# Patient Record
Sex: Female | Born: 1990 | Race: White | Hispanic: No | Marital: Single | Smoking: Never smoker
Health system: Southern US, Community
[De-identification: ages and names within clinical notes are randomized; demographics above are authoritative.]

## PROBLEM LIST (undated history)

## (undated) DIAGNOSIS — F39 Unspecified mood [affective] disorder: Secondary | ICD-10-CM

## (undated) HISTORY — PX: KNEE SURGERY: SHX244

## (undated) HISTORY — PX: TONSILLECTOMY: SUR1361

---

## 2019-02-14 ENCOUNTER — Other Ambulatory Visit: Payer: Self-pay

## 2019-02-14 ENCOUNTER — Ambulatory Visit
Admission: EM | Admit: 2019-02-14 | Discharge: 2019-02-14 | Disposition: A | Discharge status: Home / Self Care | Payer: BC Managed Care – PPO | Attending: Family Medicine | Admitting: Family Medicine

## 2019-02-14 DIAGNOSIS — R52 Pain, unspecified: Secondary | ICD-10-CM

## 2019-02-14 DIAGNOSIS — R5383 Other fatigue: Secondary | ICD-10-CM

## 2019-02-14 DIAGNOSIS — R112 Nausea with vomiting, unspecified: Secondary | ICD-10-CM | POA: Diagnosis not present

## 2019-02-14 DIAGNOSIS — J029 Acute pharyngitis, unspecified: Secondary | ICD-10-CM | POA: Diagnosis not present

## 2019-02-14 DIAGNOSIS — M791 Myalgia, unspecified site: Secondary | ICD-10-CM | POA: Diagnosis not present

## 2019-02-14 HISTORY — DX: Unspecified mood (affective) disorder: F39

## 2019-02-14 LAB — CBC WITH DIFFERENTIAL/PLATELET
Abs Immature Granulocytes: 0.03 10*3/uL (ref 0.00–0.07)
Basophils Absolute: 0.1 10*3/uL (ref 0.0–0.1)
Basophils Relative: 1 %
Eosinophils Absolute: 0.2 10*3/uL (ref 0.0–0.5)
Eosinophils Relative: 2 %
HCT: 41.4 % (ref 36.0–46.0)
Hemoglobin: 13.4 g/dL (ref 12.0–15.0)
Immature Granulocytes: 0 %
Lymphocytes Relative: 18 %
Lymphs Abs: 2 10*3/uL (ref 0.7–4.0)
MCH: 31.3 pg (ref 26.0–34.0)
MCHC: 32.4 g/dL (ref 30.0–36.0)
MCV: 96.7 fL (ref 80.0–100.0)
Monocytes Absolute: 0.7 10*3/uL (ref 0.1–1.0)
Monocytes Relative: 6 %
Neutro Abs: 8.3 10*3/uL — ABNORMAL HIGH (ref 1.7–7.7)
Neutrophils Relative %: 73 %
Platelets: 286 10*3/uL (ref 150–400)
RBC: 4.28 MIL/uL (ref 3.87–5.11)
RDW: 12.4 % (ref 11.5–15.5)
WBC: 11.3 10*3/uL — ABNORMAL HIGH (ref 4.0–10.5)
nRBC: 0 % (ref 0.0–0.2)

## 2019-02-14 LAB — RAPID STREP SCREEN (MED CTR MEBANE ONLY): Streptococcus, Group A Screen (Direct): NEGATIVE

## 2019-02-14 LAB — MONONUCLEOSIS SCREEN: Mono Screen: NEGATIVE

## 2019-02-14 MED ORDER — AMOXICILLIN-POT CLAVULANATE 875-125 MG PO TABS: 1.0000 | ORAL_TABLET | Freq: Two times a day (BID) | ORAL | 0 refills | Status: AC

## 2019-02-14 NOTE — ED Triage Notes (Signed)
Patient states that 6 days ago she started having a sore throat, body aches, fatigue, chills without fever. Patient states that she doesn't feel like she is improving like she thought she would

## 2019-02-14 NOTE — ED Provider Notes (Signed)
[facility]    CSN: [id number] Arrival date & time: 02/14/19  1046      History   Chief Complaint Chief Complaint  Patient presents with  . Generalized Body Aches    HPI Judy Barr is a 28 y.o. female.   28 year old female presents with sore throat, body aches, chills, fatigue, headache for about 1 week. Also having nausea and lightheadedness. Vomited and had diarrhea yesterday but no vomiting yet today. No distinct fever but has had a temp of 99 and low 100 the past few days. Minimal nasal congestion. No cough. No known exposure to COVID 19. No known exposure to strep. Did have mono about 10 years ago. Has taken Nyquil and Ibuprofen with some relief. Other chronic health issues include mood disorder and currently on Lamictal, Lithium, Seroquel, Vraylar, Inderal and OrthoCyclen daily.   The history is provided by the patient.    Past Medical History:  Diagnosis Date  . Mood disorder ([place])     There are no active problems to display for this patient.   Past Surgical History:  Procedure Laterality Date  . KNEE SURGERY Left    x 3  . TONSILLECTOMY      OB History   No obstetric history on file.      Home Medications    Prior to Admission medications   Medication Sig Start Date End Date Taking? Authorizing Provider  lamoTRIgine (LAMICTAL) 100 MG tablet Take 200 mg by mouth daily. 01/28/19  Yes [provider]  lithium carbonate 300 MG capsule Take 1,200 mg by mouth at bedtime. 02/03/19  Yes [provider]  norgestimate-ethinyl estradiol (ORTHO-CYCLEN) 0.25-35 MG-MCG tablet Take by mouth.   Yes [provider]  propranolol (INDERAL) 20 MG tablet Take 20 mg by mouth 3 (three) times daily as needed. 01/28/19  Yes [provider]  QUEtiapine (SEROQUEL) 100 MG tablet Take 100 mg by mouth at bedtime. 02/06/19  Yes [provider]  VRAYLAR capsule Take 1.5 mg by mouth daily. 02/04/19  Yes [provider]  amoxicillin-clavulanate (AUGMENTIN) 875-125 MG tablet Take 1 tablet by mouth every 12 (twelve) hours for 7 days. 02/14/19 02/21/19  Katy Apo, NP    Family History Family History  Problem Relation Age of Onset  . Healthy Mother   . Melanoma Father     Social History Social History   Tobacco Use  . Smoking status: Never Smoker  . Smokeless tobacco: Never Used  Substance Use Topics  . Alcohol use: Yes    Comment: socially  . Drug use: Not Currently     Allergies   Effexor xr [venlafaxine hcl er]   Review of Systems Review of Systems  Constitutional: Positive for appetite change, chills, fatigue and fever (possible low grade). Negative for activity change and diaphoresis.  HENT: Positive for congestion, postnasal drip, rhinorrhea and sore throat. Negative for ear discharge, ear pain, facial swelling, mouth sores, nosebleeds, sinus pressure, sinus pain, sneezing and trouble swallowing.   Eyes: Negative for pain, discharge, redness and itching.  Respiratory: Negative for cough, chest tightness, shortness of breath and wheezing.   Cardiovascular: Negative for chest pain and palpitations.  Gastrointestinal: Positive for diarrhea, nausea and vomiting. Negative for blood in stool and constipation.  Genitourinary: Negative for decreased urine volume and difficulty urinating.  Musculoskeletal: Positive for arthralgias and myalgias. Negative for neck pain and neck stiffness.  Skin: Negative for color change, rash and wound.  Allergic/Immunologic: Negative for environmental  allergies and immunocompromised state.  Neurological: Positive for light-headedness and headaches. Negative for dizziness, tremors, seizures, syncope, weakness and numbness.  Hematological: Negative for adenopathy. Does not bruise/bleed easily.  Psychiatric/Behavioral: Positive for dysphoric mood.     Physical Exam Triage Vital Signs ED Triage Vitals  Enc Vitals Group     BP 02/14/19 1108 92/71      Pulse Rate 02/14/19 1108 76     Resp 02/14/19 1108 16     Temp 02/14/19 1108 99.4 F (37.4 C)     Temp Source 02/14/19 1108 Oral     SpO2 02/14/19 1108 100 %     Weight 02/14/19 1105 160 lb (72.6 kg)     Height 02/14/19 1105 5\' 4"  (1.626 m)     Head Circumference --      Peak Flow --      Pain Score 02/14/19 1105 4     Pain Loc --      Pain Edu? --      Excl. in [place]? --    No data found.  Updated Vital Signs BP 92/71 (BP Location: Left Arm)   Pulse 76   Temp 99.4 F (37.4 C) (Oral)   Resp 16   Ht 5\' 4"  (1.626 m)   Wt 160 lb (72.6 kg)   LMP 02/12/2019   SpO2 100%   BMI 27.46 kg/m   Visual Acuity Right Eye Distance:   Left Eye Distance:   Bilateral Distance:    Right Eye Near:   Left Eye Near:    Bilateral Near:     Physical Exam Vitals signs and nursing note reviewed.  Constitutional:      General: She is awake. She is not in acute distress.    Appearance: She is well-developed and well-groomed. She is ill-appearing.     Comments: Patient sitting comfortably on exam table in no acute distress but appears ill.   HENT:     Head: Normocephalic and atraumatic.     Right Ear: Hearing, tympanic membrane, ear canal and external ear normal.     Left Ear: Hearing, tympanic membrane, ear canal and external ear normal.     Nose: Rhinorrhea present. Rhinorrhea is clear.     Right Sinus: No maxillary sinus tenderness or frontal sinus tenderness.     Left Sinus: No maxillary sinus tenderness or frontal sinus tenderness.     Mouth/Throat:     Lips: Pink.     Mouth: Mucous membranes are moist.     Pharynx: Uvula midline. Posterior oropharyngeal erythema present. No pharyngeal swelling, oropharyngeal exudate or uvula swelling.  Eyes:     Extraocular Movements: Extraocular movements intact.     Conjunctiva/sclera: Conjunctivae normal.  Neck:     Musculoskeletal: Normal range of motion and neck supple. No neck rigidity or muscular tenderness.  Cardiovascular:     Rate and  Rhythm: Normal rate and regular rhythm.     Heart sounds: Normal heart sounds. No murmur.  Pulmonary:     Effort: Pulmonary effort is normal. No respiratory distress.     Breath sounds: Normal breath sounds and air entry. No decreased air movement. No decreased breath sounds, wheezing, rhonchi or rales.  Abdominal:     General: Abdomen is flat. Bowel sounds are normal. There is no distension.     Palpations: Abdomen is soft.     Tenderness: There is generalized abdominal tenderness. There is no right CVA tenderness or left CVA tenderness.  Musculoskeletal: Normal range of motion.  Lymphadenopathy:     Cervical: Cervical adenopathy present.     Right cervical: Superficial cervical adenopathy present.     Left cervical: Superficial cervical adenopathy present.  Skin:    General: Skin is warm and dry.     Capillary Refill: Capillary refill takes less than 2 seconds.     Findings: No rash.  Neurological:     General: No focal deficit present.     Mental Status: She is alert and oriented to person, place, and time.  Psychiatric:        Mood and Affect: Mood normal.        Behavior: Behavior normal. Behavior is cooperative.        Thought Content: Thought content normal.        Judgment: Judgment normal.      UC Treatments / Results  Labs (all labs ordered are listed, but only abnormal results are displayed) Labs Reviewed  CBC WITH DIFFERENTIAL/PLATELET - Abnormal; Notable for the following components:      Result Value   WBC 11.3 (*)    Neutro Abs 8.3 (*)    All other components within normal limits  RAPID STREP SCREEN ([facility] ONLY)  NOVEL CORONAVIRUS, NAA (HOSP ORDER, SEND-OUT TO REF LAB; TAT 18-24 HRS)  CULTURE, GROUP A STREP [facility])  MONONUCLEOSIS SCREEN    EKG   Radiology No results found.  Procedures Procedures (including critical care time)  Medications Ordered in UC Medications - No data to display  Initial Impression / Assessment and Plan / UC Course   I have reviewed the triage vital signs and the nursing notes.  Pertinent labs & imaging results that were available during my care of the patient were reviewed by me and considered in my medical decision making (see chart for details).    Reviewed negative rapid strep test and mono with patient. Reviewed CBC results- slight increase in WBC's with increase in Neutrophils- discussed that she may have a bacterial throat infection- could still be strep. Culture sent. Also discussed that she may have COVID 19. Due to CBC results, will start on Augmentin 875mg  twice a day as directed. Recommend continue Ibuprofen 600mg  every 6 to 8 hours as needed for pain and fever. Rest. Stay at home. Continue to push fluids. Note written for work. Follow-up pending COVID 19 test results and in 3 to 4 days if not improving.   Final Clinical Impressions(s) / UC Diagnoses   Final diagnoses:  Acute pharyngitis, unspecified etiology  Body aches  Fatigue, unspecified type  Non-intractable vomiting with nausea, unspecified vomiting type     Discharge Instructions     Recommend start Augmentin 875mg  twice a day as directed. Continue Ibuprofen 600mg  every 6 to 8 hours as needed for sore throat and body aches. Continue to push fluids to stay hydrated. Rest. Stay at home. Follow-up pending COVID 19 test results and in 3 to 4 days if not improving.     ED Prescriptions    Medication Sig Dispense Auth. Provider   amoxicillin-clavulanate (AUGMENTIN) 875-125 MG tablet Take 1 tablet by mouth every 12 (twelve) hours for 7 days. 14 tablet Riddik Senna, , NP     PDMP not reviewed this encounter.   , NP 02/14/19 1929

## 2019-02-14 NOTE — Discharge Instructions (Addendum)
Recommend start Augmentin 875mg  twice a day as directed. Continue Ibuprofen 600mg  every 6 to 8 hours as needed for sore throat and body aches. Continue to push fluids to stay hydrated. Rest. Stay at home. Follow-up pending COVID 19 test results and in 3 to 4 days if not improving.

## 2019-02-15 LAB — NOVEL CORONAVIRUS, NAA (HOSP ORDER, SEND-OUT TO REF LAB; TAT 18-24 HRS): SARS-CoV-2, NAA: NOT DETECTED

## 2019-02-17 LAB — CULTURE, GROUP A STREP (THRC)

## 2019-05-13 ENCOUNTER — Ambulatory Visit (INDEPENDENT_AMBULATORY_CARE_PROVIDER_SITE_OTHER)
Admit: 2019-05-13 | Discharge: 2019-05-13 | Disposition: A | Discharge status: Home / Self Care | Payer: Worker's Compensation | Attending: Family Medicine | Admitting: Family Medicine

## 2019-05-13 ENCOUNTER — Other Ambulatory Visit: Payer: Self-pay

## 2019-05-13 ENCOUNTER — Encounter: Payer: Self-pay | Admitting: Emergency Medicine

## 2019-05-13 ENCOUNTER — Ambulatory Visit
Admission: EM | Admit: 2019-05-13 | Discharge: 2019-05-13 | Disposition: A | Discharge status: Home / Self Care | Payer: Worker's Compensation | Attending: Family Medicine | Admitting: Family Medicine

## 2019-05-13 DIAGNOSIS — R112 Nausea with vomiting, unspecified: Secondary | ICD-10-CM | POA: Diagnosis not present

## 2019-05-13 DIAGNOSIS — S0990XA Unspecified injury of head, initial encounter: Secondary | ICD-10-CM

## 2019-05-13 DIAGNOSIS — S060X0A Concussion without loss of consciousness, initial encounter: Secondary | ICD-10-CM | POA: Diagnosis not present

## 2019-05-13 DIAGNOSIS — W19XXXA Unspecified fall, initial encounter: Secondary | ICD-10-CM

## 2019-05-13 MED ORDER — ONDANSETRON 8 MG PO TBDP: 8.0000 mg | ORAL_TABLET | Freq: Three times a day (TID) | ORAL | 0 refills | Status: DC | PRN

## 2019-05-13 NOTE — Discharge Instructions (Addendum)
Tylenol as needed for headache Follow up in 1 week at [facility]

## 2019-05-13 NOTE — ED Triage Notes (Signed)
Patient states she fell at work today and hit her head on the concrete curb. She states about 10 minutes later she vomited and she has a headache.

## 2019-05-17 NOTE — ED Provider Notes (Signed)
[facility]    CSN: [id number] Arrival date & time: 05/13/19  9924      History   Chief Complaint Chief Complaint  Patient presents with  . Headache  . Fall    HPI Judy Barr is a 29 y.o. female.   29 yo female with a c/o head injury this morning after falling at work. States she hit the back of her head on concrete when she fell. States about 10 minutes later she vomited. Currently complains of nausea and a headache. Denies any loss of consciousness, vision changes, numbness/tingling, unilateral weakness. Has not taken any medications since injury.    Headache Fall Associated symptoms include headaches.    Past Medical History:  Diagnosis Date  . Mood disorder ([facility])     There are no problems to display for this patient.   Past Surgical History:  Procedure Laterality Date  . KNEE SURGERY Left    x 3  . TONSILLECTOMY      OB History   No obstetric history on file.      Home Medications    Prior to Admission medications   Medication Sig Start Date End Date Taking? Authorizing Provider  ARIPiprazole (ABILIFY) 5 MG tablet Take 5 mg by mouth daily. 04/21/19  Yes [provider]  busPIRone (BUSPAR) 10 MG tablet Take 10 mg by mouth 2 (two) times daily. 04/21/19  Yes [provider]  lamoTRIgine (LAMICTAL) 100 MG tablet Take 200 mg by mouth daily. 01/28/19  Yes [provider]  lithium carbonate 300 MG capsule Take 1,200 mg by mouth at bedtime. 02/03/19  Yes [provider]  norgestimate-ethinyl estradiol (ORTHO-CYCLEN) 0.25-35 MG-MCG tablet Take by mouth.   Yes [provider]  QUEtiapine (SEROQUEL) 100 MG tablet Take 100 mg by mouth at bedtime. 02/06/19  Yes [provider]  ondansetron (ZOFRAN ODT) 8 MG disintegrating tablet Take 1 tablet (8 mg total) by mouth every 8 (eight) hours as needed. 05/13/19   Payton Mccallum, MD  propranolol (INDERAL) 20 MG tablet Take 20 mg by mouth 3 (three) times  daily as needed. 01/28/19   [provider]  VRAYLAR capsule Take 1.5 mg by mouth daily. 02/04/19   [provider]    Family History Family History  Problem Relation Age of Onset  . Healthy Mother   . Melanoma Father     Social History Social History   Tobacco Use  . Smoking status: Never Smoker  . Smokeless tobacco: Never Used  Substance Use Topics  . Alcohol use: Yes    Comment: socially  . Drug use: Not Currently     Allergies   Effexor xr [venlafaxine hcl er]   Review of Systems Review of Systems  Neurological: Positive for headaches.     Physical Exam Triage Vital Signs ED Triage Vitals  Enc Vitals Group     BP 05/13/19 1011 130/86     Pulse Rate 05/13/19 1011 93     Resp 05/13/19 1011 18     Temp 05/13/19 1011 98.7 F (37.1 C)     Temp Source 05/13/19 1011 Oral     SpO2 05/13/19 1011 100 %     Weight 05/13/19 1009 170 lb (77.1 kg)     Height 05/13/19 1009 5\' 4"  (1.626 m)     Head Circumference --      Peak Flow --      Pain Score 05/13/19 1009 6     Pain Loc --  Pain Edu? --      Excl. in [place]? --    No data found.  Updated Vital Signs BP 130/86 (BP Location: Right Arm)   Pulse 93   Temp 98.7 F (37.1 C) (Oral)   Resp 18   Ht 5\' 4"  (1.626 m)   Wt 77.1 kg   LMP 05/06/2019   SpO2 100%   BMI 29.18 kg/m   Visual Acuity Right Eye Distance:   Left Eye Distance:   Bilateral Distance:    Right Eye Near:   Left Eye Near:    Bilateral Near:     Physical Exam Vitals and nursing note reviewed.  Constitutional:      General: She is not in acute distress.    Appearance: She is not toxic-appearing or diaphoretic.  HENT:     Head: Normocephalic. Abrasion (mild; noted to back of scalp) present. No raccoon eyes, Battle's sign, right periorbital erythema, left periorbital erythema or laceration.     Right Ear: Tympanic membrane, ear canal and external ear normal.     Left Ear: Tympanic membrane, ear canal and external ear  normal.     Mouth/Throat:     Pharynx: Oropharynx is clear. Uvula midline.  Eyes:     Extraocular Movements: Extraocular movements intact.     Conjunctiva/sclera: Conjunctivae normal.     Pupils: Pupils are equal, round, and reactive to light.  Cardiovascular:     Rate and Rhythm: Normal rate.  Pulmonary:     Effort: Pulmonary effort is normal. No respiratory distress.  Abdominal:     General: There is no distension.     Palpations: Abdomen is soft.  Musculoskeletal:     Cervical back: Normal range of motion and neck supple. No rigidity or tenderness.  Neurological:     General: No focal deficit present.     Mental Status: She is alert and oriented to person, place, and time.     Cranial Nerves: No cranial nerve deficit.  Psychiatric:        Behavior: Behavior normal.        Thought Content: Thought content normal.      UC Treatments / Results  Labs (all labs ordered are listed, but only abnormal results are displayed) Labs Reviewed - No data to display  EKG   Radiology No results found.  Procedures Procedures (including critical care time)  Medications Ordered in UC Medications - No data to display  Initial Impression / Assessment and Plan / UC Course  I have reviewed the triage vital signs and the nursing notes.  Pertinent labs & imaging results that were available during my care of the patient were reviewed by me and considered in my medical decision making (see chart for details).      Final Clinical Impressions(s) / UC Diagnoses   Final diagnoses:  Injury of head, initial encounter  Fall, initial encounter  Concussion without loss of consciousness, initial encounter  Non-intractable vomiting with nausea, unspecified vomiting type     Discharge Instructions     Tylenol as needed for headache Follow up in 1 week at [facility]     ED Prescriptions    Medication Sig Dispense Auth. Provider   ondansetron (ZOFRAN ODT) 8 MG  disintegrating tablet Take 1 tablet (8 mg total) by mouth every 8 (eight) hours as needed. 6 tablet Norval Gable, MD      1. CT result and diagnosis reviewed with patient 2. rx as per orders above; reviewed  possible side effects, interactions, risks and benefits  3. Recommend supportive treatment as above; work restrictions (light duty) as per work form 4. Follow up in 1 week at [facility] 5. Follow-up prn   PDMP not reviewed this encounter.   Payton Mccallum, MD 05/17/19 1328

## 2019-07-20 ENCOUNTER — Encounter: Payer: Self-pay | Admitting: Emergency Medicine

## 2019-07-20 ENCOUNTER — Other Ambulatory Visit: Payer: Self-pay

## 2019-07-20 ENCOUNTER — Ambulatory Visit
Admission: EM | Admit: 2019-07-20 | Discharge: 2019-07-20 | Disposition: A | Discharge status: Home / Self Care | Payer: BLUE CROSS/BLUE SHIELD | Attending: Emergency Medicine | Admitting: Emergency Medicine

## 2019-07-20 DIAGNOSIS — M7918 Myalgia, other site: Secondary | ICD-10-CM | POA: Insufficient documentation

## 2019-07-20 LAB — URINALYSIS, COMPLETE (UACMP) WITH MICROSCOPIC
Bacteria, UA: NONE SEEN
Bilirubin Urine: NEGATIVE
Glucose, UA: NEGATIVE mg/dL
Ketones, ur: NEGATIVE mg/dL
Leukocytes,Ua: NEGATIVE
Nitrite: NEGATIVE
Protein, ur: NEGATIVE mg/dL
Specific Gravity, Urine: 1.015 (ref 1.005–1.030)
pH: 7 (ref 5.0–8.0)

## 2019-07-20 LAB — CBC WITH DIFFERENTIAL/PLATELET
Abs Immature Granulocytes: 0.04 10*3/uL (ref 0.00–0.07)
Basophils Absolute: 0 10*3/uL (ref 0.0–0.1)
Basophils Relative: 0 %
Eosinophils Absolute: 0.2 10*3/uL (ref 0.0–0.5)
Eosinophils Relative: 2 %
HCT: 36.5 % (ref 36.0–46.0)
Hemoglobin: 11.9 g/dL — ABNORMAL LOW (ref 12.0–15.0)
Immature Granulocytes: 0 %
Lymphocytes Relative: 12 %
Lymphs Abs: 1.3 10*3/uL (ref 0.7–4.0)
MCH: 31.6 pg (ref 26.0–34.0)
MCHC: 32.6 g/dL (ref 30.0–36.0)
MCV: 97.1 fL (ref 80.0–100.0)
Monocytes Absolute: 0.5 10*3/uL (ref 0.1–1.0)
Monocytes Relative: 5 %
Neutro Abs: 8.5 10*3/uL — ABNORMAL HIGH (ref 1.7–7.7)
Neutrophils Relative %: 81 %
Platelets: 243 10*3/uL (ref 150–400)
RBC: 3.76 MIL/uL — ABNORMAL LOW (ref 3.87–5.11)
RDW: 12.9 % (ref 11.5–15.5)
WBC: 10.5 10*3/uL (ref 4.0–10.5)
nRBC: 0 % (ref 0.0–0.2)

## 2019-07-20 LAB — BASIC METABOLIC PANEL
Anion gap: 9 (ref 5–15)
BUN: 6 mg/dL (ref 6–20)
CO2: 21 mmol/L — ABNORMAL LOW (ref 22–32)
Calcium: 9.6 mg/dL (ref 8.9–10.3)
Chloride: 103 mmol/L (ref 98–111)
Creatinine, Ser: 0.79 mg/dL (ref 0.44–1.00)
GFR calc Af Amer: >60 mL/min (ref >60)
GFR calc non Af Amer: >60 mL/min (ref >60)
Glucose, Bld: 106 mg/dL — ABNORMAL HIGH (ref 70–99)
Potassium: 4.1 mmol/L (ref 3.5–5.1)
Sodium: 133 mmol/L — ABNORMAL LOW (ref 135–145)

## 2019-07-20 LAB — SEDIMENTATION RATE: Sed Rate: 35 mm/hr — ABNORMAL HIGH (ref 0–20)

## 2019-07-20 LAB — CK: Total CK: <5 U/L — ABNORMAL LOW (ref 38–234)

## 2019-07-20 MED ORDER — METHYLPREDNISOLONE 4 MG PO TBPK: ORAL_TABLET | ORAL | 0 refills | Status: AC

## 2019-07-20 MED ORDER — METAXALONE 800 MG PO TABS: 800.0000 mg | ORAL_TABLET | Freq: Three times a day (TID) | ORAL | 0 refills | Status: AC

## 2019-07-20 NOTE — ED Provider Notes (Signed)
[facility]    CSN: [id number] Arrival date & time: 07/20/19  0836      History   Chief Complaint Chief Complaint  Patient presents with  . Muscle Pain    HPI Judy Barr is a 29 y.o. female.   HPI  29 year old female presents with muscle pain and stiffness that started about 1 week ago.  She has no history of injury.  Pain  is "all over her body" and is worsening.  She states last night she was unable to sleep because of the pain.  Her pain seems to be mostly in her right knee, both of her ankles, both of her hips laterally and shoulders.  She has recently started Adderall within the last 3weeks.  Was prescribed by her psychiatrist.  Does not endorse any fevers or chills.  She has not had any sweats at nighttime.  She works as a Emergency planning/management officer.  She also states that the pain seems to be more muscular than her joints.         Past Medical History:  Diagnosis Date  . Mood disorder ([place])     There are no problems to display for this patient.   Past Surgical History:  Procedure Laterality Date  . KNEE SURGERY Left    x 3  . TONSILLECTOMY      OB History   No obstetric history on file.      Home Medications    Prior to Admission medications   Medication Sig Start Date End Date Taking? Authorizing Provider  amphetamine-dextroamphetamine (ADDERALL) 20 MG tablet Take 20 mg by mouth daily.   Yes [provider]  ARIPiprazole (ABILIFY) 5 MG tablet Take 5 mg by mouth daily. 04/21/19  Yes [provider]  busPIRone (BUSPAR) 10 MG tablet Take 10 mg by mouth 2 (two) times daily. 04/21/19  Yes [provider]  lamoTRIgine (LAMICTAL) 100 MG tablet Take 200 mg by mouth daily. 01/28/19  Yes [provider]  lithium carbonate 300 MG capsule Take 1,200 mg by mouth at bedtime. 02/03/19  Yes [provider]  norgestimate-ethinyl estradiol (ORTHO-CYCLEN) 0.25-35 MG-MCG tablet Take by mouth.   Yes [provider]  QUEtiapine (SEROQUEL) 100 MG tablet Take 100 mg by mouth at bedtime. 02/06/19  Yes [provider]  metaxalone (SKELAXIN) 800 MG tablet Take 1 tablet (800 mg total) by mouth 3 (three) times daily. 07/20/19   Lorin Picket, PA-C  methylPREDNISolone (MEDROL DOSEPAK) 4 MG TBPK tablet Take per package instructions 07/20/19   Lorin Picket, PA-C  propranolol (INDERAL) 20 MG tablet Take 20 mg by mouth 3 (three) times daily as needed. 01/28/19 07/20/19  [provider]    Family History Family History  Problem Relation Age of Onset  . Healthy Mother   . Melanoma Father     Social History Social History   Tobacco Use  . Smoking status: Never Smoker  . Smokeless tobacco: Never Used  Substance Use Topics  . Alcohol use: Yes    Comment: socially  . Drug use: Not Currently     Allergies   Effexor xr [venlafaxine hcl er]   Review of Systems Review of Systems  Constitutional: Positive for activity change and fatigue. Negative for appetite change, chills, diaphoresis and fever.  Musculoskeletal: Positive for myalgias.  All other systems reviewed and are negative.    Physical Exam Triage Vital Signs ED Triage Vitals  Enc Vitals Group     BP 07/20/19 0855  116/60     Pulse Rate 07/20/19 0855 94     Resp 07/20/19 0855 18     Temp 07/20/19 0855 99.3 F (37.4 C)     Temp Source 07/20/19 0855 Oral     SpO2 07/20/19 0855 99 %     Weight 07/20/19 0853 165 lb (74.8 kg)     Height 07/20/19 0853 '5\' 4"'  (1.626 m)     Head Circumference --      Peak Flow --      Pain Score 07/20/19 0853 8     Pain Loc --      Pain Edu? --      Excl. in [place]? --    No data found.  Updated Vital Signs BP 116/60 (BP Location: Right Arm)   Pulse 94   Temp 99.3 F (37.4 C) (Oral)   Resp 18   Ht '5\' 4"'  (1.626 m)   Wt 165 lb (74.8 kg)   LMP 07/06/2019   SpO2 99%   BMI 28.32 kg/m   Visual Acuity Right Eye Distance:   Left Eye Distance:   Bilateral Distance:      Right Eye Near:   Left Eye Near:    Bilateral Near:     Physical Exam Vitals and nursing note reviewed.  Constitutional:      General: She is not in acute distress.    Appearance: Normal appearance. She is not ill-appearing or toxic-appearing.  HENT:     Head: Normocephalic and atraumatic.  Eyes:     Conjunctiva/sclera: Conjunctivae normal.  Musculoskeletal:        General: Tenderness present. No deformity or signs of injury.     Cervical back: Normal range of motion and neck supple.  Skin:    General: Skin is warm and dry.  Neurological:     General: No focal deficit present.     Mental Status: She is alert and oriented to person, place, and time.  Psychiatric:        Mood and Affect: Mood normal.        Behavior: Behavior normal.        Thought Content: Thought content normal.        Judgment: Judgment normal.      UC Treatments / Results  Labs (all labs ordered are listed, but only abnormal results are displayed) Labs Reviewed  CBC WITH DIFFERENTIAL/PLATELET - Abnormal; Notable for the following components:      Result Value   RBC 3.76 (*)    Hemoglobin 11.9 (*)    Neutro Abs 8.5 (*)    All other components within normal limits  SEDIMENTATION RATE - Abnormal; Notable for the following components:   Sed Rate 35 (*)    All other components within normal limits  BASIC METABOLIC PANEL - Abnormal; Notable for the following components:   Sodium 133 (*)    CO2 21 (*)    Glucose, Bld 106 (*)    All other components within normal limits  CK - Abnormal; Notable for the following components:   Total CK <5 (*)    All other components within normal limits  URINALYSIS, COMPLETE (UACMP) WITH MICROSCOPIC - Abnormal; Notable for the following components:   Color, Urine STRAW (*)    Hgb urine dipstick TRACE (*)    All other components within normal limits    EKG   Radiology No results found.  Procedures Procedures (including critical care time)  Medications  Ordered in UC Medications - No  data to display  Initial Impression / Assessment and Plan / UC Course  I have reviewed the triage vital signs and the nursing notes.  Pertinent labs & imaging results that were available during my care of the patient were reviewed by me and considered in my medical decision making (see chart for details).   29 year old female presents with a muscle pain and stiffness that started over a week ago.  She states that she hurts all over her body and the pain is worsening.  She had recently started a prescription of Adderall approximately 3 weeks before this visit.  There is a complication of rhabdomyolysis listed on research of Adderall.  For this reason the patient underwentA BMP CBC and CK.  These are all normal.  She did have a slightly elevated ESR of 35.  Physical examination showed stiffness of several joints particularly her knees and her hips.  I told her that she likely had  myofascial muscle pain.  However because of the elevated ESR and the amount of pain that she was having I recommended that she follow-up with her primary care physician or rheumatologist for further evaluation.  Did provide her with prescriptions for Skelaxin and a Medrol Dosepak.  She will make arrangements with her primary care physician or rheumatologist for further evaluation if she continues to have her symptoms. Final Clinical Impressions(s) / UC Diagnoses   Final diagnoses:  Muscle pain, myofascial     Discharge Instructions     Use  Caution while taking muscle relaxers.  Do not perform activities requiring concentration or judgment and do not drive.  Please follow-up with a primary care physician or rheumatologist for further evaluation if your symptoms do not improve    ED Prescriptions    Medication Sig Dispense Auth. Provider   metaxalone (SKELAXIN) 800 MG tablet Take 1 tablet (800 mg total) by mouth 3 (three) times daily. 21 tablet Crecencio Mc P, PA-C    methylPREDNISolone (MEDROL DOSEPAK) 4 MG TBPK tablet Take per package instructions 21 tablet Lorin Picket, PA-C     PDMP not reviewed this encounter.   Lorin Picket, PA-C 07/20/19 2148

## 2019-07-20 NOTE — Discharge Instructions (Addendum)
Use  Caution while taking muscle relaxers.  Do not perform activities requiring concentration or judgment and do not drive.  Please follow-up with a primary care physician or rheumatologist for further evaluation if your symptoms do not improve

## 2019-07-20 NOTE — ED Triage Notes (Signed)
Patient c/o muscle pain and stiffness that started over 1 week ago. She states it hurts all over her body and the pain is getting worse. Denies injury.

## 2021-02-14 IMAGING — CT CT HEAD W/O CM
1 series · 16 of 28 positions shown, 20 images · non-contrast
Comparison: None.

CLINICAL DATA: Headache following fall

EXAM:
CT HEAD WITHOUT CONTRAST
TECHNIQUE: Contiguous axial images were obtained from the base of the skull
through the vertex without intravenous contrast.

[Series 2: head wo · axial · 0.47mm/px · z∈[-348,-223]mm · 16 of 28 slices shown, 20 images]
[im 2/28  brain]
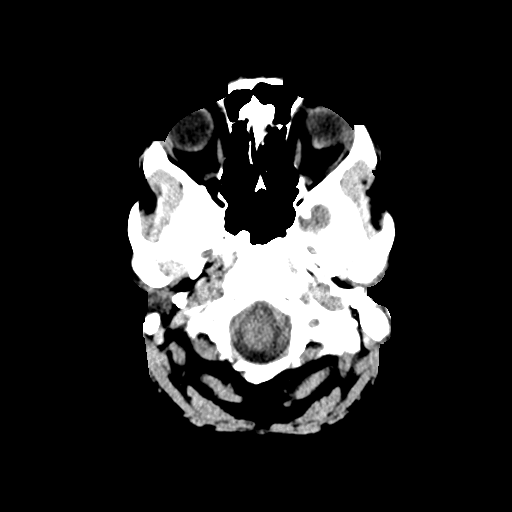
[im 2/28  bone]
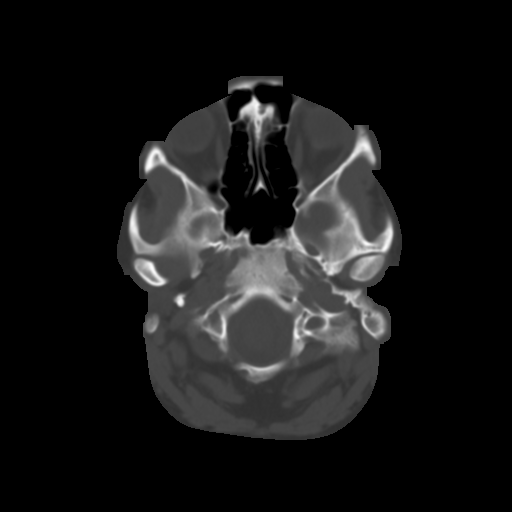
[im 4/28  brain]
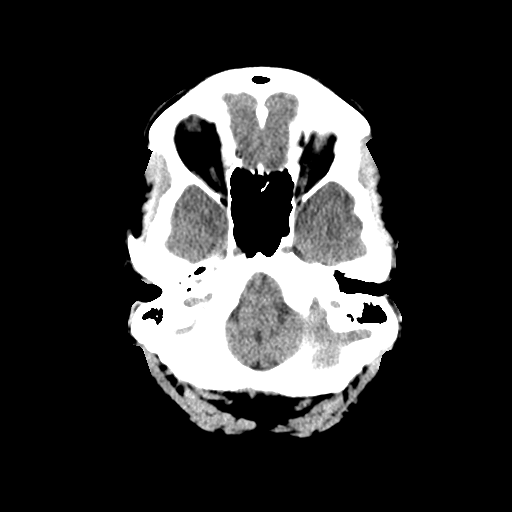
[im 6/28  brain]
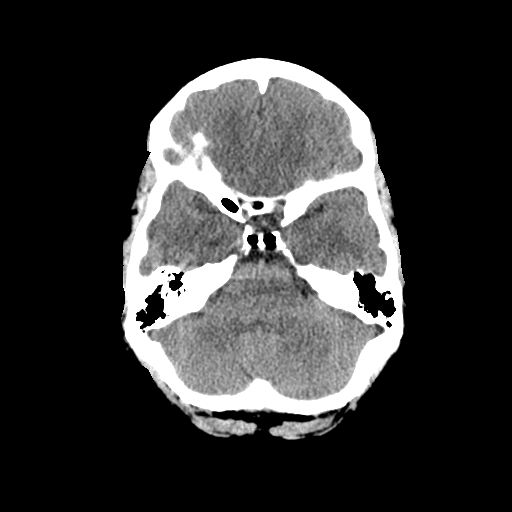
[im 7/28  brain]
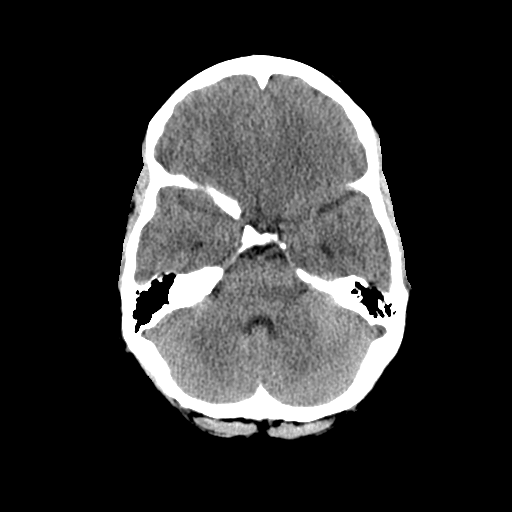
[im 9/28  brain]
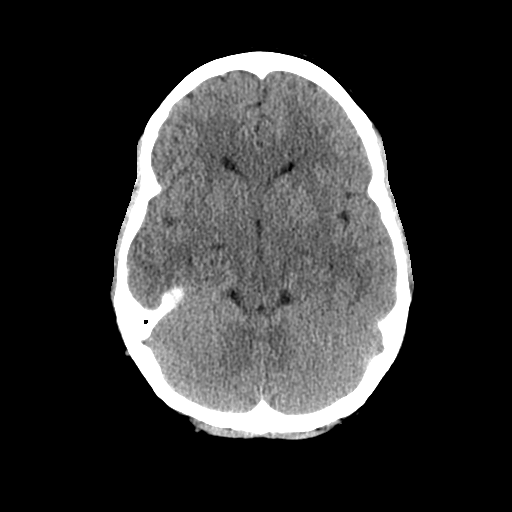
[im 9/28  bone]
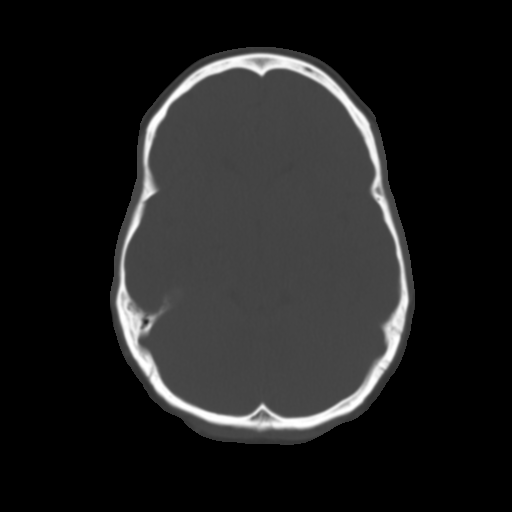
[im 10/28  brain]
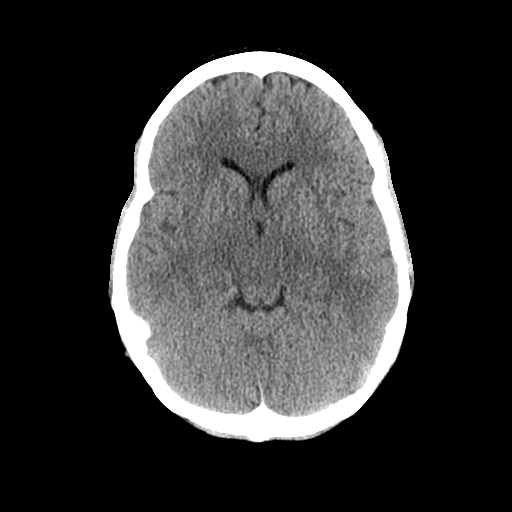
[im 12/28  brain]
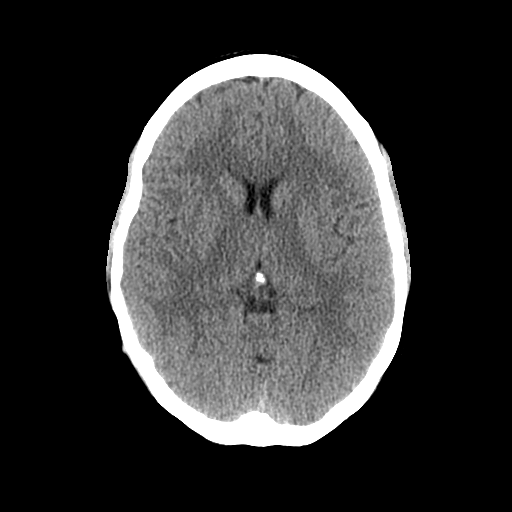
[im 14/28  brain]
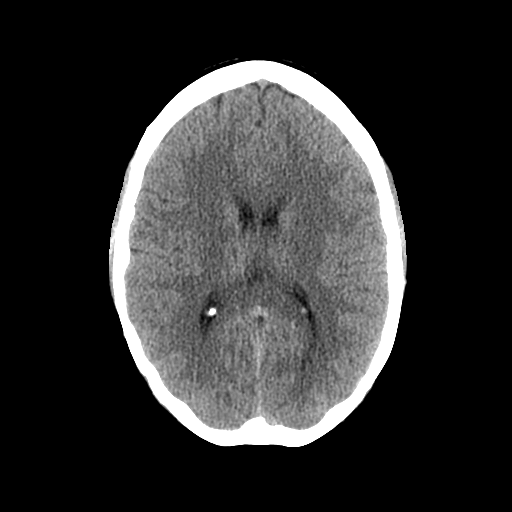
[im 15/28  brain]
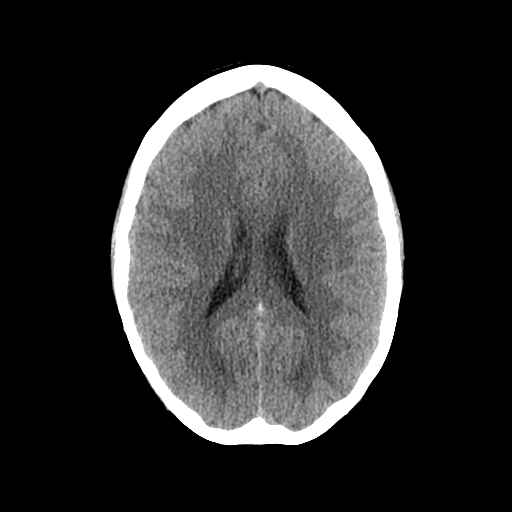
[im 15/28  bone]
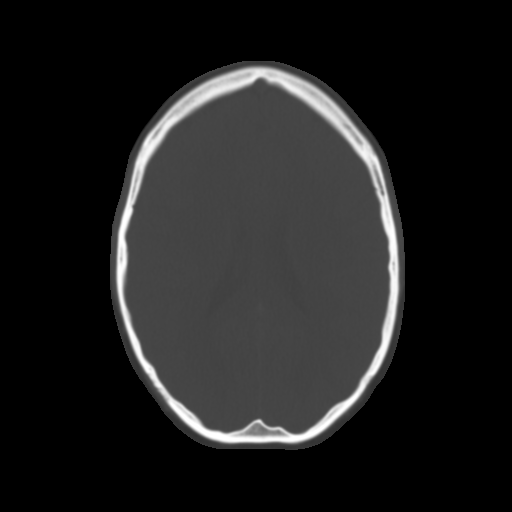
[im 17/28  brain]
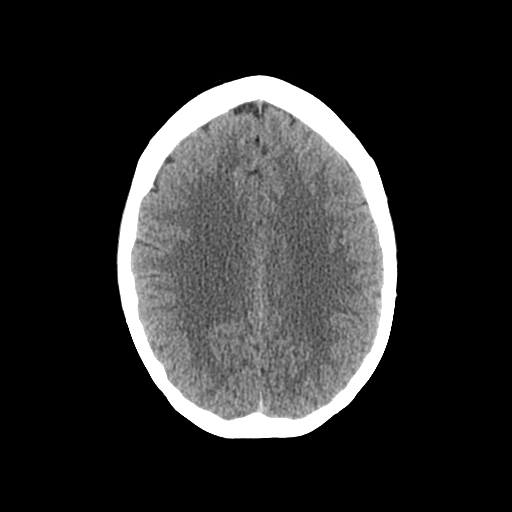
[im 19/28  brain]
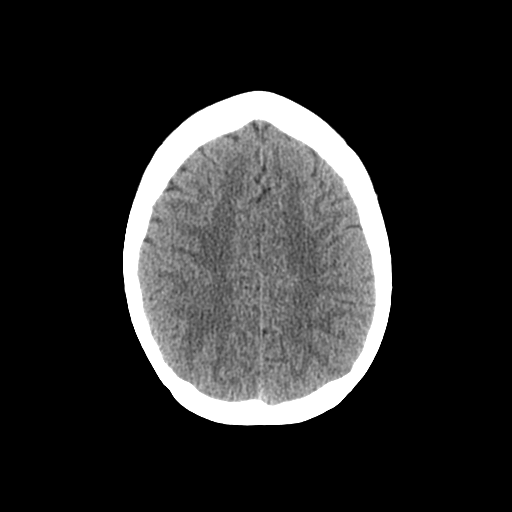
[im 20/28  brain]
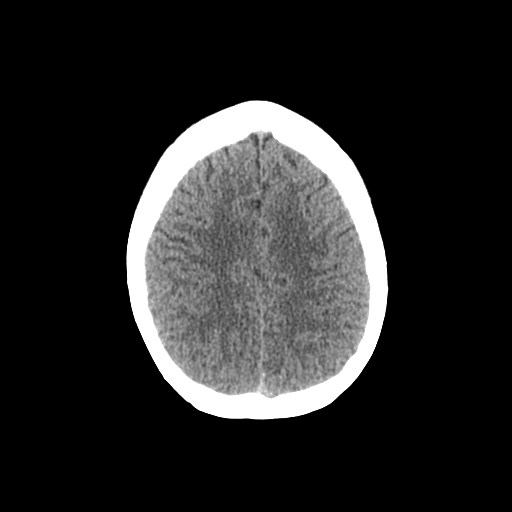
[im 22/28  brain]
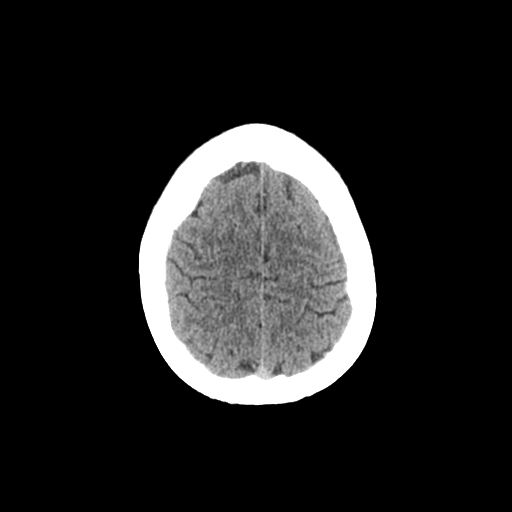
[im 22/28  bone]
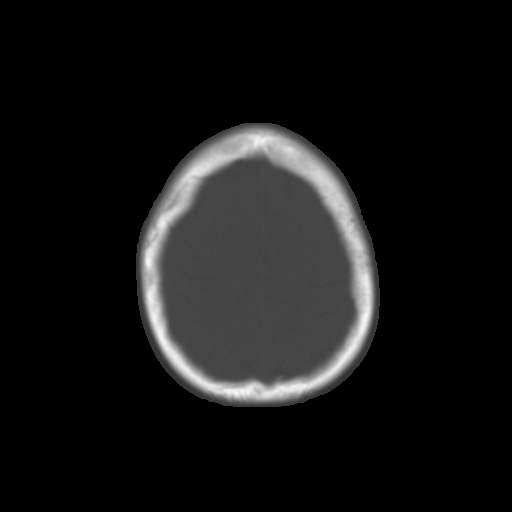
[im 23/28  brain]
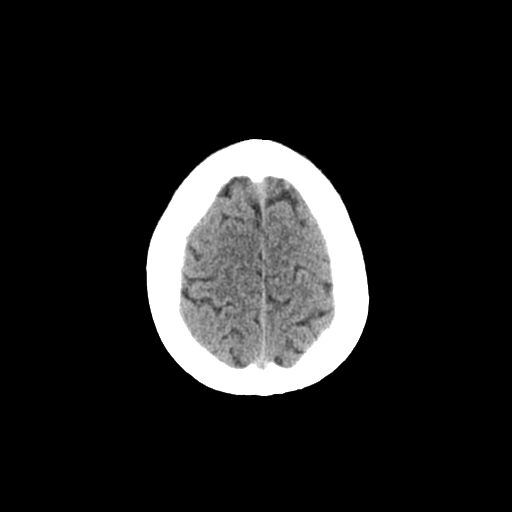
[im 25/28  brain]
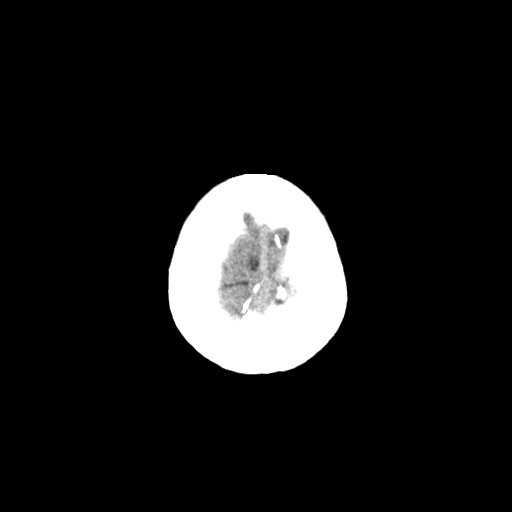
[im 27/28  brain]
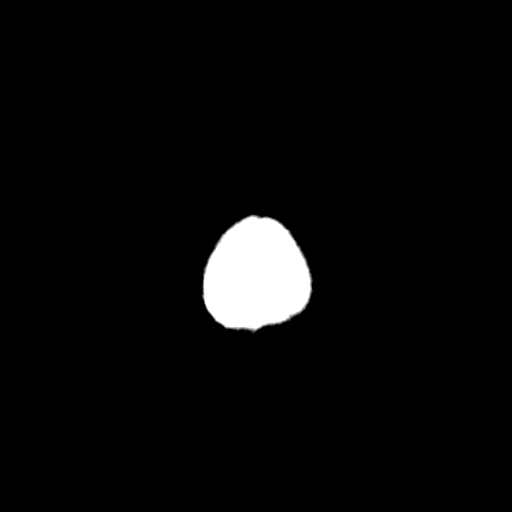

[16 of 28 positions shown; findings below may reference images not displayed]

FINDINGS: Brain: The ventricles and sulci are normal in size and
configuration. There is no intracranial mass, hemorrhage,
extra-axial fluid collection, or midline shift. Brain parenchyma
appears unremarkable. No evident acute infarct.

Vascular: No hyperdense vessel.  No evident vascular calcification.

Skull: The bony calvarium appears intact.

Sinuses/Orbits: There is mucosal thickening in several ethmoid air
cells. Other visualized paranasal sinuses are clear. Visualized
orbits appear symmetric bilaterally.

Other: Mastoid air cells are clear.
IMPRESSION: Mucosal thickening in several ethmoid air cells. Study otherwise
unremarkable.
